# Patient Record
Sex: Male | Born: 2000 | Race: White | Hispanic: No | Marital: Single | Smoking: Former smoker
Health system: Southern US, Community
[De-identification: ages and names within clinical notes are randomized; demographics above are authoritative.]

---
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
## 2020-08-21 ENCOUNTER — Other Ambulatory Visit: Payer: Self-pay

## 2020-08-21 DIAGNOSIS — R21 Rash and other nonspecific skin eruption: Secondary | ICD-10-CM | POA: Insufficient documentation

## 2020-08-21 DIAGNOSIS — Z5321 Procedure and treatment not carried out due to patient leaving prior to being seen by health care provider: Secondary | ICD-10-CM | POA: Insufficient documentation

## 2020-08-22 ENCOUNTER — Other Ambulatory Visit: Payer: Self-pay

## 2020-08-22 ENCOUNTER — Encounter (HOSPITAL_BASED_OUTPATIENT_CLINIC_OR_DEPARTMENT_OTHER): Payer: Self-pay | Admitting: *Deleted

## 2020-08-22 ENCOUNTER — Emergency Department (HOSPITAL_BASED_OUTPATIENT_CLINIC_OR_DEPARTMENT_OTHER)
Admission: EM | Admit: 2020-08-22 | Discharge: 2020-08-22 | Disposition: A | Discharge status: Home / Self Care | Payer: Self-pay | Attending: Emergency Medicine | Admitting: Emergency Medicine

## 2020-08-22 NOTE — ED Triage Notes (Addendum)
C/o rash to neck abd and arms x 1 day , seen at [facility] for same, no relief with meds, at completion of triage mother states " we are leaving "

## 2020-10-11 ENCOUNTER — Other Ambulatory Visit: Payer: Self-pay

## 2020-10-11 ENCOUNTER — Ambulatory Visit: Payer: Self-pay | Admitting: Gastroenterology

## 2020-10-11 ENCOUNTER — Encounter: Payer: Self-pay | Admitting: Gastroenterology

## 2020-10-11 VITALS — BP 108/62 | HR 64 | Ht 71.0 in | Wt 134.0 lb

## 2020-10-11 DIAGNOSIS — R21 Rash and other nonspecific skin eruption: Secondary | ICD-10-CM | POA: Insufficient documentation

## 2020-10-11 DIAGNOSIS — M62838 Other muscle spasm: Secondary | ICD-10-CM | POA: Insufficient documentation

## 2020-10-11 DIAGNOSIS — M791 Myalgia, unspecified site: Secondary | ICD-10-CM | POA: Insufficient documentation

## 2020-10-11 NOTE — Patient Instructions (Addendum)
If you are age 22 or older, your body mass index should be between 23-30. Your Body mass index is 18.69 kg/m. If this is out of the aforementioned range listed, please consider follow up with your Primary Care Provider.  If you are age 15 or younger, your body mass index should be between 19-25. Your Body mass index is 18.69 kg/m. If this is out of the aformentioned range listed, please consider follow up with your Primary Care Provider.   These were recommended by Doug Sou, PA-C : CBC, CMP, TSH, CRP, Sed rate, CK, and Celiac.   Also recommended that you see Rheumatology Dr.Deveshwar.   Thank you for choosing me and [facility].  Doug Sou, PA-C

## 2020-10-11 NOTE — Progress Notes (Signed)
10/11/2020 CHRISTIE COPLEY [id number] 23-Dec-2000   HISTORY OF PRESENT ILLNESS: This is a 20 year old male who is new to our office.  He is here today with his mom as a self-referral for evaluation of several issues.  They are currently uninsured and also trying to switch between a pediatrician and establish care with a general primary care physician.  He describes intermittent what sounds like muscle spasms or twitching in his abdomen.  He said that it causes like an achy discomfort, but no pain.  He says that his bowel habits are irregular, sometimes he will skip a day and sometimes he will go a couple times a day, but nothing extreme in either direction.  He also reports pins-and-needles sensation in his wrists and in his groin/testicle area.  He also reports headaches and sensitive vision recently.  He also recently broke out in a rash all over his back, his neck, his hands, and in his groin.  He said he never had a rash like that before.  They called at hives in the emergency room, but they could never figure out what would have been from as there was no changes in laundry detergents, etc.  He also reports muscle pains in the backs of his legs.  He says his appetite is great and he eats all the time, but has been unable to gain weight.  He was smoking marijuana regularly, but has stopped that recently.  He denies any nausea, vomiting, fever, chills.   Past Medical History:  Diagnosis Date  . Asthma    No past surgical history on file.  reports that he has never smoked. He has never used smokeless tobacco. He reports previous alcohol use. He reports previous drug use. family history is not on file. No Known Allergies    Outpatient Encounter Medications as of 10/11/2020  Medication Sig  . albuterol (PROVENTIL HFA;VENTOLIN HFA) 108 (90 BASE) MCG/ACT inhaler Inhale 2 puffs into the lungs every 6 (six) hours as needed. For shortness of breath  . [DISCONTINUED] cetirizine (ZYRTEC) 10 MG tablet  Take 1 tablet (10 mg total) 2 (two) times daily by mouth.  . [DISCONTINUED] diphenhydrAMINE (BENADRYL) 25 mg capsule Take 1 capsule (25 mg total) every 6 (six) hours as needed by mouth.  . [DISCONTINUED] famotidine (PEPCID) 40 MG tablet Take 1 tablet (40 mg total) daily by mouth.  . [DISCONTINUED] predniSONE (DELTASONE) 20 MG tablet Take 2 tablets (40 mg total) daily by mouth.   No facility-administered encounter medications on file as of 10/11/2020.     REVIEW OF SYSTEMS  : All other systems reviewed and negative except where noted in the History of Present Illness.   PHYSICAL EXAM: BP 108/62   Pulse 64   Ht 5\' 11"  (1.803 m)   Wt 134 lb (60.8 kg)   BMI 18.69 kg/m  General: Well developed, but thin white male in no acute distress Head: Normocephalic and atraumatic Eyes:  Sclerae anicteric, conjunctiva pink. Ears: Normal auditory acuity Lungs: Clear throughout to auscultation; no W/R/R. Heart: Regular rate and rhythm; no M/R/G. Abdomen: Soft, non-distended.  BS present.  Non-tender. Musculoskeletal: Symmetrical with no gross deformities  Skin: No lesions on visible extremities Extremities: No edema  Neurological: Alert oriented x 4, grossly non-focal Psychological:  Alert and cooperative. Normal mood and affect  ASSESSMENT AND PLAN: *20 year old male here today with several complaints.  He complains of muscle spasms/pulsations/twitchings on his abdomen, pins and needle sensations on his wrist and in his  groin/testicular area, muscle pains in his legs, a rash that broke out recently all over his back, his hands, his neck, his groin, etc, headaches, inability to gain weight, etc.  He is here as a self-referral as they are trying to transfer from a pediatrician to a regular primary care doctor.  His mother was not really quite sure where to start with all of this.  They are currently uninsured.  I do not think that is a primary GI issue driving his symptoms.  May be something  rheumatologic??  We recommended at least starting out with lab evaluation including CBC, CMP, TSH, sed rate, CRP, CK, and celiac labs.  After discussion they ultimately declined to have the labs performed today would like to hold off and have further discussion about where to go from here, etc.   CC:  Ermalinda Barrios, MD

## 2020-10-12 NOTE — Progress Notes (Signed)
Attending Physician's Attestation   I have reviewed the chart.   I agree with the Advanced Practitioner's note, impression, and recommendations with any updates as below. Multiple issues brought up today based on clinic evaluation though unclear that there is an [employer], [employer] diagnosis for all symptoms.  Needs evaluation by primary care to try to piece together multiple issues.  Agree with evaluation via laboratories at minimum to evaluate for potential abnormalities but at end of clinic this was deferred per notation.  Happy to be of assistance in future if patient/family desire.   Corliss Parish, MD [facility] Advanced Endoscopy Office # [phone]

## 2020-11-15 ENCOUNTER — Ambulatory Visit (INDEPENDENT_AMBULATORY_CARE_PROVIDER_SITE_OTHER): Payer: Self-pay | Admitting: Registered Nurse

## 2020-11-15 ENCOUNTER — Other Ambulatory Visit: Payer: Self-pay

## 2020-11-15 ENCOUNTER — Encounter: Payer: Self-pay | Admitting: Registered Nurse

## 2020-11-15 VITALS — BP 119/72 | HR 69 | Temp 97.6°F | Resp 18 | Ht 71.0 in | Wt 144.2 lb

## 2020-11-15 DIAGNOSIS — F411 Generalized anxiety disorder: Secondary | ICD-10-CM

## 2020-11-15 DIAGNOSIS — K219 Gastro-esophageal reflux disease without esophagitis: Secondary | ICD-10-CM

## 2020-11-15 MED ORDER — PANTOPRAZOLE SODIUM 40 MG PO TBEC: 40.0000 mg | DELAYED_RELEASE_TABLET | Freq: Every day | ORAL | 3 refills | Status: AC

## 2020-11-15 MED ORDER — ESCITALOPRAM OXALATE 10 MG PO TABS: ORAL_TABLET | ORAL | 0 refills | Status: AC

## 2020-11-15 NOTE — Patient Instructions (Addendum)
Kenneth Clarke -   Good to meet you today. Glad we could talk. I'm hoping these medications help, but I have also attached information on anxiety and GERD to this sheet.  Medications:  Escitalopram (Lexapro): for anxiety. Take 1/2 tablet before bed daily for one week, then increase to 1 tablet before bed daily. I'd like to check in on this medication in around 4-6 weeks to make sure things are going smoothly. If you have any side effects, you can stop taking this medication. Sometimes, there is some nausea or diarrhea for the first week or two. This usually resolves on it's own.  Pantoprazole (Protonix): for heartburn / GERD. Take 1 capsule first thing in the morning with a glass of water daily for 14 days. After that, you can use as needed when the pain arises. Refer to the information below for nonpharmacological management of heartburn.  Looking forward to seeing what progress we can make.    If you have lab work done today you will be contacted with your lab results within the next 2 weeks.  If you have not heard from [place] then please contact us. The fastest way to get your results is to register for My Chart.   IF you received an x-ray today, you will receive an invoice from [facility]. Please contact [facility] at [phone] with questions or concerns regarding your invoice.   IF you received labwork today, you will receive an invoice from [facility]. Please contact [facility] at [phone] with questions or concerns regarding your invoice.   Our billing staff will not be able to assist you with questions regarding bills from these companies.  You will be contacted with the lab results as soon as they are available. The fastest way to get your results is to activate your My Chart account. Instructions are located on the last page of this paperwork. If you have not heard from [place] regarding the results in 2 weeks, please contact this office.      Food Choices for  Gastroesophageal Reflux Disease, Adult When you have gastroesophageal reflux disease (GERD), the foods you eat and your eating habits are very important. Choosing the right foods can help ease the discomfort of GERD. Consider working with a dietitian to help you make healthy food choices. What are tips for following this plan? Reading food labels  Look for foods that are low in saturated fat. Foods that have less than 5% of daily value (DV) of fat and 0 g of trans fats may help with your symptoms. Cooking  Cook foods using methods other than frying. This may include baking, steaming, grilling, or broiling. These are all methods that do not need a lot of fat for cooking.  To add flavor, try to use herbs that are low in spice and acidity. Meal planning  Choose healthy foods that are low in fat, such as fruits, vegetables, whole grains, low-fat dairy products, lean meats, fish, and poultry.  Eat frequent, small meals instead of three large meals each day. Eat your meals slowly, in a relaxed setting. Avoid bending over or lying down until 2-3 hours after eating.  Limit high-fat foods such as fatty meats or fried foods.  Limit your intake of fatty foods, such as oils, butter, and shortening.  Avoid the following as told by your health care provider: ? Foods that cause symptoms. These may be different for different people. Keep a food diary to keep track of foods that cause symptoms. ? Alcohol. ?  Drinking large amounts of liquid with meals. ? Eating meals during the 2-3 hours before bed.   Lifestyle  Maintain a healthy weight. Ask your health care provider what weight is healthy for you. If you need to lose weight, work with your health care provider to do so safely.  Exercise for at least 30 minutes on 5 or more days each week, or as told by your health care provider.  Avoid wearing clothes that fit tightly around your waist and chest.  Do not use any products that contain nicotine or  tobacco. These products include cigarettes, chewing tobacco, and vaping devices, such as e-cigarettes. If you need help quitting, ask your health care provider.  Sleep with the head of your bed raised. Use a wedge under the mattress or blocks under the bed frame to raise the head of the bed.  Chew sugar-free gum after mealtimes. What foods should I eat? Eat a healthy, well-balanced diet of fruits, vegetables, whole grains, low-fat dairy products, lean meats, fish, and poultry. Each person is different. Foods that may trigger symptoms in one person may not trigger any symptoms in another person. Work with your health care provider to identify foods that are safe for you. The items listed above may not be a complete list of recommended foods and beverages. Contact a dietitian for more information.   What foods should I avoid? Limiting some of these foods may help manage the symptoms of GERD. Everyone is different. Consult a dietitian or your health care provider to help you identify the exact foods to avoid, if any. Fruits Any fruits prepared with added fat. Any fruits that cause symptoms. For some people this may include citrus fruits, such as oranges, grapefruit, pineapple, and lemons. Vegetables Deep-fried vegetables. [place] fries. Any vegetables prepared with added fat. Any vegetables that cause symptoms. For some people, this may include tomatoes and tomato products, chili peppers, onions and garlic, and horseradish. Grains Pastries or quick breads with added fat. Meats and other proteins High-fat meats, such as fatty beef or pork, hot dogs, ribs, ham, sausage, salami, and bacon. Fried meat or protein, including fried fish and fried chicken. Nuts and nut butters, in large amounts. Dairy Whole milk and chocolate milk. Sour cream. Cream. Ice cream. Cream cheese. Milkshakes. Fats and oils Butter. Margarine. Shortening. Ghee. Beverages Coffee and tea, with or without caffeine. Carbonated  beverages. Sodas. Energy drinks. Fruit juice made with acidic fruits, such as orange or grapefruit. Tomato juice. Alcoholic drinks. Sweets and desserts Chocolate and cocoa. Donuts. Seasonings and condiments Pepper. Peppermint and spearmint. Added salt. Any condiments, herbs, or seasonings that cause symptoms. For some people, this may include curry, hot sauce, or vinegar-based salad dressings. The items listed above may not be a complete list of foods and beverages to avoid. Contact a dietitian for more information. Questions to ask your health care provider Diet and lifestyle changes are usually the first steps that are taken to manage symptoms of GERD. If diet and lifestyle changes do not improve your symptoms, talk with your health care provider about taking medicines. Where to find more information  International Foundation for Gastrointestinal Disorders: aboutgerd.org Summary  When you have gastroesophageal reflux disease (GERD), food and lifestyle choices may be very helpful in easing the discomfort of GERD.  Eat frequent, small meals instead of three large meals each day. Eat your meals slowly, in a relaxed setting. Avoid bending over or lying down until 2-3 hours after eating.  Limit high-fat foods such  as fatty meats or fried foods. This information is not intended to replace advice given to you by your health care provider. Make sure you discuss any questions you have with your health care provider. Document Revised: 03/13/2020 Document Reviewed: 03/13/2020 Elsevier Patient Education  [place].  [employer]'s Current Therapy 2021 (pp. 213-216). [place], [place]: Elsevier.">  Gastroesophageal Reflux Disease, Adult Gastroesophageal reflux (GER) happens when acid from the stomach flows up into the tube that connects the mouth and the stomach (esophagus). Normally, food travels down the esophagus and stays in the stomach to be digested. However, when a person has GER, food and  stomach acid sometimes move back up into the esophagus. If this becomes a more serious problem, the person may be diagnosed with a disease called gastroesophageal reflux disease (GERD). GERD occurs when the reflux:  Happens often.  Causes frequent or severe symptoms.  Causes problems such as damage to the esophagus. When stomach acid comes in contact with the esophagus, the acid may cause inflammation in the esophagus. Over time, GERD may create small holes (ulcers) in the lining of the esophagus. What are the causes? This condition is caused by a problem with the muscle between the esophagus and the stomach (lower esophageal sphincter, or LES). Normally, the LES muscle closes after food passes through the esophagus to the stomach. When the LES is weakened or abnormal, it does not close properly, and that allows food and stomach acid to go back up into the esophagus. The LES can be weakened by certain dietary substances, medicines, and medical conditions, including:  Tobacco use.  Pregnancy.  Having a hiatal hernia.  Alcohol use.  Certain foods and beverages, such as coffee, chocolate, onions, and peppermint. What increases the risk? You are more likely to develop this condition if you:  Have an increased body weight.  Have a connective tissue disorder.  Take NSAIDs, such as ibuprofen. What are the signs or symptoms? Symptoms of this condition include:  Heartburn.  Difficult or painful swallowing and the feeling of having a lump in the throat.  A bitter taste in the mouth.  Bad breath and having a large amount of saliva.  Having an upset or bloated stomach and belching.  Chest pain. Different conditions can cause chest pain. Make sure you see your health care provider if you experience chest pain.  Shortness of breath or wheezing.  Ongoing (chronic) cough or a nighttime cough.  Wearing away of tooth enamel.  Weight loss. How is this diagnosed? This condition may be  diagnosed based on a medical history and a physical exam. To determine if you have mild or severe GERD, your health care provider may also monitor how you respond to treatment. You may also have tests, including:  A test to examine your stomach and esophagus with a small camera (endoscopy).  A test that measures the acidity level in your esophagus.  A test that measures how much pressure is on your esophagus.  A barium swallow or modified barium swallow test to show the shape, size, and functioning of your esophagus. How is this treated? Treatment for this condition may vary depending on how severe your symptoms are. Your health care provider may recommend:  Changes to your diet.  Medicine.  Surgery. The goal of treatment is to help relieve your symptoms and to prevent complications. Follow these instructions at home: Eating and drinking  Follow a diet as recommended by your health care provider. This may involve avoiding foods and drinks  such as: ? Coffee and tea, with or without caffeine. ? Drinks that contain alcohol. ? Energy drinks and sports drinks. ? Carbonated drinks or sodas. ? Chocolate and cocoa. ? Peppermint and mint flavorings. ? Garlic and onions. ? Horseradish. ? Spicy and acidic foods, including peppers, chili powder, curry powder, vinegar, hot sauces, and barbecue sauce. ? Citrus fruit juices and citrus fruits, such as oranges, lemons, and limes. ? Tomato-based foods, such as red sauce, chili, salsa, and pizza with red sauce. ? Fried and fatty foods, such as donuts, french fries, potato chips, and high-fat dressings. ? High-fat meats, such as hot dogs and fatty cuts of red and white meats, such as rib eye steak, sausage, ham, and bacon. ? High-fat dairy items, such as whole milk, butter, and cream cheese.  Eat small, frequent meals instead of large meals.  Avoid drinking large amounts of liquid with your meals.  Avoid eating meals during the 2-3 hours before  bedtime.  Avoid lying down right after you eat.  Do not exercise right after you eat.   Lifestyle  Do not use any products that contain nicotine or tobacco. These products include cigarettes, chewing tobacco, and vaping devices, such as e-cigarettes. If you need help quitting, ask your health care provider.  Try to reduce your stress by using methods such as yoga or meditation. If you need help reducing stress, ask your health care provider.  If you are overweight, reduce your weight to an amount that is healthy for you. Ask your health care provider for guidance about a safe weight loss goal.   General instructions  Pay attention to any changes in your symptoms.  Take over-the-counter and prescription medicines only as told by your health care provider. Do not take aspirin, ibuprofen, or other NSAIDs unless your health care provider told you to take these medicines.  Wear loose-fitting clothing. Do not wear anything tight around your waist that causes pressure on your abdomen.  Raise (elevate) the head of your bed about 6 inches (15 cm). You can use a wedge to do this.  Avoid bending over if this makes your symptoms worse.  Keep all follow-up visits. This is important. Contact a health care provider if:  You have: ? New symptoms. ? Unexplained weight loss. ? Difficulty swallowing or it hurts to swallow. ? Wheezing or a persistent cough. ? A hoarse voice.  Your symptoms do not improve with treatment. Get help right away if:  You have sudden pain in your arms, neck, jaw, teeth, or back.  You suddenly feel sweaty, dizzy, or light-headed.  You have chest pain or shortness of breath.  You vomit and the vomit is green, yellow, or black, or it looks like blood or coffee grounds.  You faint.  You have stool that is red, bloody, or black.  You cannot swallow, drink, or eat. These symptoms may represent a serious problem that is an emergency. Do not wait to see if the  symptoms will go away. Get medical help right away. Call your local emergency services (911 in the [place] Do not drive yourself to the hospital. Summary  Gastroesophageal reflux happens when acid from the stomach flows up into the esophagus. GERD is a disease in which the reflux happens often, causes frequent or severe symptoms, or causes problems such as damage to the esophagus.  Treatment for this condition may vary depending on how severe your symptoms are. Your health care provider may recommend diet and lifestyle changes, medicine, or  surgery.  Contact a health care provider if you have new or worsening symptoms.  Take over-the-counter and prescription medicines only as told by your health care provider. Do not take aspirin, ibuprofen, or other NSAIDs unless your health care provider told you to do so.  Keep all follow-up visits as told by your health care provider. This is important. This information is not intended to replace advice given to you by your health care provider. Make sure you discuss any questions you have with your health care provider. Document Revised: 03/13/2020 Document Reviewed: 03/13/2020 Elsevier Patient Education  2021 [place].  [email or web address].NIH.Gov">  Generalized Anxiety Disorder, Adult Generalized anxiety disorder (GAD) is a mental health condition. Unlike normal worries, anxiety related to GAD is not triggered by a specific event. These worries do not fade or get better with time. GAD interferes with relationships, work, and school. GAD symptoms can vary from mild to severe. People with severe GAD can have intense waves of anxiety with physical symptoms that are similar to panic attacks. What are the causes? The exact cause of GAD is not known, but the following are believed to have an impact:  Differences in natural brain chemicals.  Genes passed down from parents to children.  Differences in the way threats are perceived.  Development during  childhood.  Personality. What increases the risk? The following factors may make you more likely to develop this condition:  Being male.  Having a family history of anxiety disorders.  Being very shy.  Experiencing very stressful life events, such as the death of a loved one.  Having a very stressful family environment. What are the signs or symptoms? People with GAD often worry excessively about many things in their lives, such as their health and family. Symptoms may also include:  Mental and emotional symptoms: ? Worrying excessively about natural disasters. ? Fear of being late. ? Difficulty concentrating. ? Fears that others are judging your performance.  Physical symptoms: ? Fatigue. ? Headaches, muscle tension, muscle twitches, trembling, or feeling shaky. ? Feeling like your heart is pounding or beating very fast. ? Feeling out of breath or like you cannot take a deep breath. ? Having trouble falling asleep or staying asleep, or experiencing restlessness. ? Sweating. ? Nausea, diarrhea, or irritable bowel syndrome (IBS).  Behavioral symptoms: ? Experiencing erratic moods or irritability. ? Avoidance of new situations. ? Avoidance of people. ? Extreme difficulty making decisions. How is this diagnosed? This condition is diagnosed based on your symptoms and medical history. You will also have a physical exam. Your health care provider may perform tests to rule out other possible causes of your symptoms. To be diagnosed with GAD, a person must have anxiety that:  Is out of his or her control.  Affects several different aspects of his or her life, such as work and relationships.  Causes distress that makes him or her unable to take part in normal activities.  Includes at least three symptoms of GAD, such as restlessness, fatigue, trouble concentrating, irritability, muscle tension, or sleep problems. Before your health care provider can confirm a diagnosis of  GAD, these symptoms must be present more days than they are not, and they must last for 6 months or longer. How is this treated? This condition may be treated with:  Medicine. Antidepressant medicine is usually prescribed for long-term daily control. Anti-anxiety medicines may be added in severe cases, especially when panic attacks occur.  Talk therapy (psychotherapy). Certain types of talk therapy can  be helpful in treating GAD by providing support, education, and guidance. Options include: ? Cognitive behavioral therapy (CBT). People learn coping skills and self-calming techniques to ease their physical symptoms. They learn to identify unrealistic thoughts and behaviors and to replace them with more appropriate thoughts and behaviors. ? Acceptance and commitment therapy (ACT). This treatment teaches people how to be mindful as a way to cope with unwanted thoughts and feelings. ? Biofeedback. This process trains you to manage your body's response (physiological response) through breathing techniques and relaxation methods. You will work with a therapist while machines are used to monitor your physical symptoms.  Stress management techniques. These include yoga, meditation, and exercise. A mental health specialist can help determine which treatment is best for you. Some people see improvement with one type of therapy. However, other people require a combination of therapies.   Follow these instructions at home: Lifestyle  Maintain a consistent routine and schedule.  Anticipate stressful situations. Create a plan, and allow extra time to work with your plan.  Practice stress management or self-calming techniques that you have learned from your therapist or your health care provider. General instructions  Take over-the-counter and prescription medicines only as told by your health care provider.  Understand that you are likely to have setbacks. Accept this and be kind to yourself as you  persist to take better care of yourself.  Recognize and accept your accomplishments, even if you judge them as small.  Keep all follow-up visits as told by your health care provider. This is important. Contact a health care provider if:  Your symptoms do not get better.  Your symptoms get worse.  You have signs of depression, such as: ? A persistently sad or irritable mood. ? Loss of enjoyment in activities that used to bring you joy. ? Change in weight or eating. ? Changes in sleeping habits. ? Avoiding friends or family members. ? Loss of energy for normal tasks. ? Feelings of guilt or worthlessness. Get help right away if:  You have serious thoughts about hurting yourself or others. If you ever feel like you may hurt yourself or others, or have thoughts about taking your own life, get help right away. Go to your nearest emergency department or:  Call your local emergency services (911 in the [place]  Call a suicide crisis helpline, such as the [facility] at (917)668-6815. This is open 24 hours a day in the [place]  Text the Crisis Text Line at 707-532-9116 (in the [place].). Summary  Generalized anxiety disorder (GAD) is a mental health condition that involves worry that is not triggered by a specific event.  People with GAD often worry excessively about many things in their lives, such as their health and family.  GAD may cause symptoms such as restlessness, trouble concentrating, sleep problems, frequent sweating, nausea, diarrhea, headaches, and trembling or muscle twitching.  A mental health specialist can help determine which treatment is best for you. Some people see improvement with one type of therapy. However, other people require a combination of therapies. This information is not intended to replace advice given to you by your health care provider. Make sure you discuss any questions you have with your health care provider. Document Revised:  06/23/2019 Document Reviewed: 06/23/2019 Elsevier Patient Education  [place], Adult After being diagnosed with an anxiety disorder, you may be relieved to know why you have felt or behaved a certain way. You may also feel overwhelmed  about the treatment ahead and what it will mean for your life. With care and support, you can manage this condition and recover from it. How to manage lifestyle changes Managing stress and anxiety Stress is your body's reaction to life changes and events, both good and bad. Most stress will last just a few hours, but stress can be ongoing and can lead to more than just stress. Although stress can play a major role in anxiety, it is not the same as anxiety. Stress is usually caused by something external, such as a deadline, test, or competition. Stress normally passes after the triggering event has ended.  Anxiety is caused by something internal, such as imagining a terrible outcome or worrying that something will go wrong that will devastate you. Anxiety often does not go away even after the triggering event is over, and it can become long-term (chronic) worry. It is important to understand the differences between stress and anxiety and to manage your stress effectively so that it does not lead to an anxious response. Talk with your health care provider or a counselor to learn more about reducing anxiety and stress. He or she may suggest tension reduction techniques, such as:  Music therapy. This can include creating or listening to music that you enjoy and that inspires you.  Mindfulness-based meditation. This involves being aware of your normal breaths while not trying to control your breathing. It can be done while sitting or walking.  Centering prayer. This involves focusing on a word, phrase, or sacred image that means something to you and brings you peace.  Deep breathing. To do this, expand your stomach and inhale slowly through your  nose. Hold your breath for 3-5 seconds. Then exhale slowly, letting your stomach muscles relax.  Self-talk. This involves identifying thought patterns that lead to anxiety reactions and changing those patterns.  Muscle relaxation. This involves tensing muscles and then relaxing them. Choose a tension reduction technique that suits your lifestyle and personality. These techniques take time and practice. Set aside 5-15 minutes a day to do them. Therapists can offer counseling and training in these techniques. The training to help with anxiety may be covered by some insurance plans. Other things you can do to manage stress and anxiety include:  Keeping a stress/anxiety diary. This can help you learn what triggers your reaction and then learn ways to manage your response.  Thinking about how you react to certain situations. You may not be able to control everything, but you can control your response.  Making time for activities that help you relax and not feeling guilty about spending your time in this way.  Visual imagery and yoga can help you stay calm and relax.   Medicines Medicines can help ease symptoms. Medicines for anxiety include:  Anti-anxiety drugs.  Antidepressants. Medicines are often used as a primary treatment for anxiety disorder. Medicines will be prescribed by a health care provider. When used together, medicines, psychotherapy, and tension reduction techniques may be the most effective treatment. Relationships Relationships can play a big part in helping you recover. Try to spend more time connecting with trusted friends and family members. Consider going to couples counseling, taking family education classes, or going to family therapy. Therapy can help you and others better understand your condition. How to recognize changes in your anxiety Everyone responds differently to treatment for anxiety. Recovery from anxiety happens when symptoms decrease and stop interfering with  your daily activities at home or work. This may mean  that you will start to:  Have better concentration and focus. Worry will interfere less in your daily thinking.  Sleep better.  Be less irritable.  Have more energy.  Have improved memory. It is important to recognize when your condition is getting worse. Contact your health care provider if your symptoms interfere with home or work and you feel like your condition is not improving. Follow these instructions at home: Activity  Exercise. Most adults should do the following: ? Exercise for at least 150 minutes each week. The exercise should increase your heart rate and make you sweat (moderate-intensity exercise). ? Strengthening exercises at least twice a week.  Get the right amount and quality of sleep. Most adults need 7-9 hours of sleep each night. Lifestyle  Eat a healthy diet that includes plenty of vegetables, fruits, whole grains, low-fat dairy products, and lean protein. Do not eat a lot of foods that are high in solid fats, added sugars, or salt.  Make choices that simplify your life.  Do not use any products that contain nicotine or tobacco, such as cigarettes, e-cigarettes, and chewing tobacco. If you need help quitting, ask your health care provider.  Avoid caffeine, alcohol, and certain over-the-counter cold medicines. These may make you feel worse. Ask your pharmacist which medicines to avoid.   General instructions  Take over-the-counter and prescription medicines only as told by your health care provider.  Keep all follow-up visits as told by your health care provider. This is important. Where to find support You can get help and support from these sources:  Self-help groups.  Online and [employer].  A trusted spiritual leader.  Couples counseling.  Family education classes.  Family therapy. Where to find more information You may find that joining a support group helps you deal with your  anxiety. The following sources can help you locate counselors or support groups near you:  [place]: www.mentalhealthamerica.net  Anxiety and Depression Association of [place] (ADAA): https://www.clark.net/  National Alliance on Mental Illness (NAMI): www.nami.org Contact a health care provider if you:  Have a hard time staying focused or finishing daily tasks.  Spend many hours a day feeling worried about everyday life.  Become exhausted by worry.  Start to have headaches, feel tense, or have nausea.  Urinate more than normal.  Have diarrhea. Get help right away if you have:  A racing heart and shortness of breath.  Thoughts of hurting yourself or others. If you ever feel like you may hurt yourself or others, or have thoughts about taking your own life, get help right away. You can go to your nearest emergency department or call:  Your local emergency services (911 in the [place]  A suicide crisis helpline, such as the [place] at 774-255-8614. This is open 24 hours a day. Summary  Taking steps to learn and use tension reduction techniques can help calm you and help prevent triggering an anxiety reaction.  When used together, medicines, psychotherapy, and tension reduction techniques may be the most effective treatment.  Family, friends, and partners can play a big part in helping you recover from an anxiety disorder. This information is not intended to replace advice given to you by your health care provider. Make sure you discuss any questions you have with your health care provider. Document Revised: 02/02/2019 Document Reviewed: 02/02/2019 Elsevier Patient Education  [place].  Budget-Friendly Healthy Eating There are many ways to save money at the grocery store and continue to  eat healthy. You can be successful if you:  Plan meals according to your budget.  Make a grocery list and only purchase food according to your  grocery list.  Prepare food yourself at home. What are tips for following this plan? Reading food labels  Compare food labels between brand name foods and the store brand. Often the nutritional value is the same, but the store brand is lower cost.  Look for products that do not have added sugar, fat, or salt (sodium). These often cost the same but are healthier for you. Products may be labeled as: ? Sugar-free. ? Nonfat. ? Low-fat. ? Sodium-free. ? Low-sodium.  Look for lean ground beef labeled as at least 92% lean and 8% fat. Shopping  Buy only the items on your grocery list and go only to the areas of the store that have the items on your list.  Use coupons only for foods and brands you normally buy. Avoid buying items you wouldn't normally buy simply because they are on sale.  Check online and in newspapers for weekly deals.  Buy healthy items from the bulk bins when available, such as herbs, spices, flour, pasta, nuts, and dried fruit.  Buy fruits and vegetables that are in season. Prices are usually lower on in-season produce.  Look at the unit price on the price tag. Use it to compare different brands and sizes to find out which item is the best deal.  Choose healthy items that are often low-cost, such as carrots, potatoes, apples, bananas, and oranges. Dried or canned beans are a low-cost protein source.  Buy in bulk and freeze extra food. Items you can buy in bulk include meats, fish, poultry, frozen fruits, and frozen vegetables.  Avoid buying "ready-to-eat" foods, such as pre-cut fruits and vegetables and pre-made salads.  If possible, shop around to discover where you can find the best prices. Consider other retailers such as dollar stores, larger [employer], local fruit and vegetable stands, and farmers markets.  Do not shop when you are hungry. If you shop while hungry, it may be hard to stick to your list and budget.  Resist impulse buying. Use your grocery  list as your official plan for the week.  Buy a variety of vegetables and fruits by purchasing fresh, frozen, and canned items.  Look at the top and bottom shelves for deals. Foods at eye level (eye level of an adult or child) are usually more expensive.  Be efficient with your time when shopping. The more time you spend at the store, the more money you are likely to spend.  To save money when choosing more expensive foods like meats and dairy: ? Choose cheaper cuts of meat, such as bone-in chicken thighs and drumsticks instead of skinless and boneless chicken. When you are ready to prepare the chicken, you can remove the skin yourself to make it healthier. ? Choose lean meats like chicken or [place] instead of beef. ? Choose canned seafood, such as tuna, salmon, or sardines. ? Buy eggs as a low-cost source of protein. ? Buy dried beans and peas, such as lentils, split peas, or kidney beans instead of meats. Dried beans and peas are a good alternative source of protein. ? Buy the larger tubs of yogurt instead of individual-sized containers.  Choose water instead of sodas and other sweetened beverages.  Avoid buying chips, cookies, and other "junk food." These items are usually expensive and not healthy.   Cooking  Make extra food and  freeze the extras in meal-sized containers or in individual portions for fast meals and snacks.  Pre-cook on days when you have extra time to prepare meals in advance. You can keep these meals in the fridge or freezer and reheat for a quick meal.  When you come home from the grocery store, wash, peel, and cut fruits and vegetables so they are ready to use and eat. This will help reduce food waste. Meal planning  Do not eat out or get fast food. Prepare food at home.  Make a grocery list and make sure to bring it with you to the store. If you have a smart phone, you could use your phone to create your shopping list.  Plan meals and snacks according to a  grocery list and budget you create.  Use leftovers in your meal plan for the week.  Look for recipes where you can cook once and make enough food for two meals.  Prepare budget-friendly types of meals like stews, casseroles, and stir-fry dishes.  Try some meatless meals or try "no cook" meals like salads.  Make sure that half your plate is filled with fruits or vegetables. Choose from fresh, frozen, or canned fruits and vegetables. If eating canned, remember to rinse them before eating. This will remove any excess salt added for packaging. Summary  Eating healthy on a budget is possible if you plan your meals according to your budget, purchase according to your budget and grocery list, and prepare food yourself.  Tips for buying more food on a limited budget include buying generic brands, using coupons only for foods you normally buy, and buying healthy items from the bulk bins when available.  Tips for buying cheaper food to replace expensive food include choosing cheaper, lean cuts of meat, and buying dried beans and peas. This information is not intended to replace advice given to you by your health care provider. Make sure you discuss any questions you have with your health care provider. Document Revised: 06/15/2020 Document Reviewed: 06/15/2020 Elsevier Patient Education  2021 [place] for Athletes Athletes need strength and endurance. Following a healthy eating plan can help you build and maintain this. As an athlete, the amount of calories you need depends on your weight, age, gender, and sport. Working with a sports Building services engineer can help you determine how many calories you need each day. Calories come in the form of carbohydrates, fats, and proteins. The following is a general guide for athletes:  Carbohydrates are a good source of energy. Try to get 55-60% of your calories from carbohydrates.  Fats are a good source of energy. Try to get no more than 30%  of your calories from fats.  Proteins help build and maintain muscle, but they are not a great source of energy. Try to get 10-15% of your calories from proteins. What are tips for following this plan? General tips  Eat about 2-4 hours before sports activities.  Drink enough fluids to prevent dehydration. ? Drink a small amount of water or other caffeine-free drink every 15-20 minutes while you are active. Do not wait until you get thirsty. ? Water is best for replacing body fluid losses (rehydration) during daily workouts and shorter events. ? Drink an electrolyte replacement fluid during long workouts and events. This is important because sweating may reduce your body minerals (electrolytes).  After a sports activity, drink a protein drink, such as chocolate milk, to help with muscle recovery.  During training, eat  and drink regularly throughout the day. Do not skip meals. Meals should include carbohydrates, proteins, and fat. Consider small, frequent meals to meet energy needs.  Taking extra vitamins, minerals, or other supplements is usually not necessary if you follow a healthy eating plan. Calcium and iron Include foods that are rich in calcium and iron, such as whole-grain cereal with milk. Calcium helps keep bones strong, and iron helps keep muscles healthy and working efficiently. Simple and complex carbohydrates  Eat or drink simple carbohydrates for a quick energy boost. These include: ? Energy bars. ? Chocolate. ? Sugary sports drinks.  Eat or drink complex carbohydrates for long-term energy. These include: ? Fortified whole-grain cereal or oatmeal. ? Fruits and vegetables, including leafy green vegetables. ? Whole-grain breads, pastas, or brown rice.   Unsaturated fats  Unsaturated fats are the best type of fat to include in your diet. They do not raise your risk for heart disease, and they provide energy once your body burns through carbohydrates. Examples of  unsaturated fats include: ? Vegetable oils. ? Cold water fish, such as salmon, trout, Atlantic mackerel, cod, and sardines. ? Nuts. ? Seeds. Protein Include these sources of protein in your eating plan:  Lean meat.  Fish.  Poultry.  Dairy from milk, cheese, and yogurt. These are also good sources of calcium.  Nuts.  Soy.  Beans.  Peanut butter.   Summary  As an athlete, the amount of calories you need depends on your weight, age, gender, and sport.  Carbohydrates and fats are good sources of energy. Proteins are best for building and maintaining muscle.  Include foods in your diet that are rich in calcium and iron.  If you follow a healthy eating plan, then taking vitamins, minerals, or other supplements is usually not needed. This information is not intended to replace advice given to you by your health care provider. Make sure you discuss any questions you have with your health care provider. Document Revised: 01/06/2020 Document Reviewed: 01/06/2020 Elsevier Patient Education  2021 [place] Following a healthy eating pattern may help you to achieve and maintain a healthy body weight, reduce the risk of chronic disease, and live a long and productive life. It is important to follow a healthy eating pattern at an appropriate calorie level for your body. Your nutritional needs should be met primarily through food by choosing a variety of nutrient-rich foods. What are tips for following this plan? Reading food labels  Read labels and choose the following: ? Reduced or low sodium. ? Juices with 100% fruit juice. ? Foods with low saturated fats and high polyunsaturated and monounsaturated fats. ? Foods with whole grains, such as whole wheat, cracked wheat, brown rice, and wild rice. ? Whole grains that are fortified with folic acid. This is recommended for women who are pregnant or who want to become pregnant.  Read labels and avoid the  following: ? Foods with a lot of added sugars. These include foods that contain brown sugar, corn sweetener, corn syrup, dextrose, fructose, glucose, high-fructose corn syrup, honey, invert sugar, lactose, malt syrup, maltose, molasses, raw sugar, sucrose, trehalose, or turbinado sugar.  Do not eat more than the following amounts of added sugar per day:  6 teaspoons (25 g) for women.  9 teaspoons (38 g) for men. ? Foods that contain processed or refined starches and grains. ? Refined grain products, such as white flour, degermed cornmeal, white bread, and white rice. Shopping  Choose nutrient-rich snacks, such  as vegetables, whole fruits, and nuts. Avoid high-calorie and high-sugar snacks, such as potato chips, fruit snacks, and candy.  Use oil-based dressings and spreads on foods instead of solid fats such as butter, stick margarine, or cream cheese.  Limit pre-made sauces, mixes, and "instant" products such as flavored rice, instant noodles, and ready-made pasta.  Try more plant-protein sources, such as tofu, tempeh, black beans, edamame, lentils, nuts, and seeds.  Explore eating plans such as the Mediterranean diet or vegetarian diet. Cooking  Use oil to saut or stir-fry foods instead of solid fats such as butter, stick margarine, or lard.  Try baking, boiling, grilling, or broiling instead of frying.  Remove the fatty part of meats before cooking.  Steam vegetables in water or broth. Meal planning  At meals, imagine dividing your plate into fourths: ? One-half of your plate is fruits and vegetables. ? One-fourth of your plate is whole grains. ? One-fourth of your plate is protein, especially lean meats, poultry, eggs, tofu, beans, or nuts.  Include low-fat dairy as part of your daily diet.   Lifestyle  Choose healthy options in all settings, including home, work, school, restaurants, or stores.  Prepare your food safely: ? Wash your hands after handling raw  meats. ? Keep food preparation surfaces clean by regularly washing with hot, soapy water. ? Keep raw meats separate from ready-to-eat foods, such as fruits and vegetables. ? Cook seafood, meat, poultry, and eggs to the recommended internal temperature. ? Store foods at safe temperatures. In general:  Keep cold foods at 11F (4.4C) or below.  Keep hot foods at 111F (60C) or above.  Keep your freezer at [facility] (-17.8C) or below.  Foods are no longer safe to eat when they have been between the temperatures of 40-111F (4.4-60C) for more than 2 hours. What foods should I eat? Fruits Aim to eat 2 cup-equivalents of fresh, canned (in natural juice), or frozen fruits each day. Examples of 1 cup-equivalent of fruit include 1 small apple, 8 large strawberries, 1 cup canned fruit,  cup dried fruit, or 1 cup 100% juice. Vegetables Aim to eat 2-3 cup-equivalents of fresh and frozen vegetables each day, including different varieties and colors. Examples of 1 cup-equivalent of vegetables include 2 medium carrots, 2 cups raw, leafy greens, 1 cup chopped vegetable (raw or cooked), or 1 medium baked potato. Grains Aim to eat 6 ounce-equivalents of whole grains each day. Examples of 1 ounce-equivalent of grains include 1 slice of bread, 1 cup ready-to-eat cereal, 3 cups popcorn, or  cup cooked rice, pasta, or cereal. Meats and other proteins Aim to eat 5-6 ounce-equivalents of protein each day. Examples of 1 ounce-equivalent of protein include 1 egg, 1/2 cup nuts or seeds, or 1 tablespoon (16 g) peanut butter. A cut of meat or fish that is the size of a deck of cards is about 3-4 ounce-equivalents.  Of the protein you eat each week, try to have at least 8 ounces come from seafood. This includes salmon, trout, herring, and anchovies. Dairy Aim to eat 3 cup-equivalents of fat-free or low-fat dairy each day. Examples of 1 cup-equivalent of dairy include 1 cup (240 mL) milk, 8 ounces (250 g) yogurt, 1  ounces (44 g) natural cheese, or 1 cup (240 mL) fortified soy milk. Fats and oils  Aim for about 5 teaspoons (21 g) per day. Choose monounsaturated fats, such as canola and olive oils, avocados, peanut butter, and most nuts, or polyunsaturated fats, such as sunflower, corn, and  soybean oils, walnuts, pine nuts, sesame seeds, sunflower seeds, and flaxseed. Beverages  Aim for six 8-oz glasses of water per day. Limit coffee to three to five 8-oz cups per day.  Limit caffeinated beverages that have added calories, such as soda and energy drinks.  Limit alcohol intake to no more than 1 drink a day for nonpregnant women and 2 drinks a day for men. One drink equals 12 oz of beer (355 mL), 5 oz of wine (148 mL), or 1 oz of hard liquor (44 mL). Seasoning and other foods  Avoid adding excess amounts of salt to your foods. Try flavoring foods with herbs and spices instead of salt.  Avoid adding sugar to foods.  Try using oil-based dressings, sauces, and spreads instead of solid fats. This information is based on general [place] nutrition guidelines. For more information, visit [employer].es. Exact amounts may vary based on your nutrition needs. Summary  A healthy eating plan may help you to maintain a healthy weight, reduce the risk of chronic diseases, and stay active throughout your life.  Plan your meals. Make sure you eat the right portions of a variety of nutrient-rich foods.  Try baking, boiling, grilling, or broiling instead of frying.  Choose healthy options in all settings, including home, work, school, restaurants, or stores. This information is not intended to replace advice given to you by your health care provider. Make sure you discuss any questions you have with your health care provider. Document Revised: 12/15/2017 Document Reviewed: 12/15/2017 Elsevier Patient Education  2021 [place] stands for fermentable oligosaccharides,  disaccharides, monosaccharides, and polyols. These are sugars that are hard for some people to digest. A low-FODMAP eating plan may help some people who have irritable bowel syndrome (IBS) and certain other bowel (intestinal) diseases to manage their symptoms. This meal plan can be complicated to follow. Work with a diet and nutrition specialist (dietitian) to make a low-FODMAP eating plan that is right for you. A dietitian can help make sure that you get enough nutrition from this diet. What are tips for following this plan? Reading food labels  Check labels for hidden FODMAPs such as: ? High-fructose syrup. ? Honey. ? Agave. ? Natural fruit flavors. ? Onion or garlic powder.  Choose low-FODMAP foods that contain 3-4 grams of fiber per serving.  Check food labels for serving sizes. Eat only one serving at a time to make sure FODMAP levels stay low. Shopping  Shop with a list of foods that are recommended on this diet and make a meal plan. Meal planning  Follow a low-FODMAP eating plan for up to 6 weeks, or as told by your health care provider or dietitian.  To follow the eating plan: 1. Eliminate high-FODMAP foods from your diet completely. Choose only low-FODMAP foods to eat. You will do this for 2-6 weeks. 2. Gradually reintroduce high-FODMAP foods into your diet one at a time. Most people should wait a few days before introducing the next new high-FODMAP food into their meal plan. Your dietitian can recommend how quickly you may reintroduce foods. 3. Keep a daily record of what and how much you eat and drink. Make note of any symptoms that you have after eating. 4. Review your daily record with a dietitian regularly to identify which foods you can eat and which foods you should avoid. General tips  Drink enough fluid each day to keep your urine pale yellow.  Avoid processed foods. These often have added  sugar and may be high in FODMAPs.  Avoid most dairy products, whole grains,  and sweeteners.  Work with a dietitian to make sure you get enough fiber in your diet.  Avoid high FODMAP foods at meals to manage symptoms. Recommended foods Fruits Bananas, oranges, tangerines, lemons, limes, blueberries, raspberries, strawberries, grapes, cantaloupe, honeydew melon, kiwi, papaya, passion fruit, and pineapple. Limited amounts of dried cranberries, banana chips, and shredded coconut. Vegetables Eggplant, zucchini, cucumber, peppers, green beans, bean sprouts, lettuce, arugula, kale, Swiss chard, spinach, collard greens, bok choy, summer squash, potato, and tomato. Limited amounts of corn, carrot, and sweet potato. Green parts of scallions. Grains Gluten-free grains, such as rice, oats, buckwheat, quinoa, corn, polenta, and millet. Gluten-free pasta, bread, or cereal. Rice noodles. Corn tortillas. Meats and other proteins Unseasoned beef, pork, poultry, or fish. Eggs. [place] Salines. Tofu (firm) and tempeh. Limited amounts of nuts and seeds, such as almonds, walnuts, [place] nuts, pecans, peanuts, nut butters, pumpkin seeds, chia seeds, and sunflower seeds. Dairy Lactose-free milk, yogurt, and kefir. Lactose-free cottage cheese and ice cream. Non-dairy milks, such as almond, coconut, hemp, and rice milk. Non-dairy yogurt. Limited amounts of goat cheese, brie, mozzarella, parmesan, swiss, and other hard cheeses. Fats and oils Butter-free spreads. Vegetable oils, such as olive, canola, and sunflower oil. Seasoning and other foods Artificial sweeteners with names that do not end in "ol," such as aspartame, saccharine, and stevia. Maple syrup, white table sugar, raw sugar, brown sugar, and molasses. Mayonnaise, soy sauce, and tamari. Fresh basil, coriander, parsley, rosemary, and thyme. Beverages Water and mineral water. Sugar-sweetened soft drinks. Small amounts of orange juice or cranberry juice. Black and green tea. Most dry wines. Coffee. The items listed above may not be a complete list  of foods and beverages you can eat. Contact a dietitian for more information. Foods to avoid Fruits Fresh, dried, and juiced forms of apple, pear, watermelon, peach, plum, cherries, apricots, blackberries, boysenberries, figs, nectarines, and mango. Avocado. Vegetables Chicory root, artichoke, asparagus, cabbage, snow peas, Brussels sprouts, broccoli, sugar snap peas, mushrooms, celery, and cauliflower. Onions, garlic, leeks, and the white part of scallions. Grains Wheat, including kamut, durum, and semolina. Barley and bulgur. Couscous. Wheat-based cereals. Wheat noodles, bread, crackers, and pastries. Meats and other proteins Fried or fatty meat. Sausage. Cashews and pistachios. Soybeans, baked beans, black beans, chickpeas, kidney beans, fava beans, navy beans, lentils, black-eyed peas, and split peas. Dairy Milk, yogurt, ice cream, and soft cheese. Cream and sour cream. Milk-based sauces. Custard. Buttermilk. Soy milk. Seasoning and other foods Any sugar-free gum or candy. Foods that contain artificial sweeteners such as sorbitol, mannitol, isomalt, or xylitol. Foods that contain honey, high-fructose corn syrup, or agave. Bouillon, vegetable stock, beef stock, and chicken stock. Garlic and onion powder. Condiments made with onion, such as hummus, chutney, pickles, relish, salad dressing, and salsa. Tomato paste. Beverages Chicory-based drinks. Coffee substitutes. Chamomile tea. Fennel tea. Sweet or fortified wines such as port or sherry. Diet soft drinks made with isomalt, mannitol, maltitol, sorbitol, or xylitol. Apple, pear, and mango juice. Juices with high-fructose corn syrup. The items listed above may not be a complete list of foods and beverages you should avoid. Contact a dietitian for more information. Summary  FODMAP stands for fermentable oligosaccharides, disaccharides, monosaccharides, and polyols. These are sugars that are hard for some people to digest.  A low-FODMAP eating  plan is a short-term diet that helps to ease symptoms of certain bowel diseases.  The eating plan usually lasts up to 6  weeks. After that, high-FODMAP foods are reintroduced gradually and one at a time. This can help you find out which foods may be causing symptoms.  A low-FODMAP eating plan can be complicated. It is best to work with a dietitian who has experience with this type of plan. This information is not intended to replace advice given to you by your health care provider. Make sure you discuss any questions you have with your health care provider. Document Revised: 01/20/2020 Document Reviewed: 01/20/2020 Elsevier Patient Education  [place].

## 2021-07-30 NOTE — Progress Notes (Signed)
Established Patient Office Visit  Subjective:  Patient ID: Kenneth Clarke, male    DOB: 2000-10-30  Age: 20 y.o. MRN: [id number]  CC:  Chief Complaint  Patient presents with   Abdominal Pain    Patient states that he has been having some abdominal pain at the top of stomach to the bottom. Patient states when we work out some of the pain goes away.    HPI Kenneth Clarke presents for abdominal pain  Generalized. No triggers that he can identify Only pattern is working out improves pain Aching burning pain No change to bowel movements. Denies nausea and vomiting. No sudden weight changes.   Otherwise no acute concerns Past Medical History:  Diagnosis Date   Asthma     No past surgical history on file.  No family history on file.  Social History   Socioeconomic History   Marital status: Single    Spouse name: Not on file   Number of children: Not on file   Years of education: Not on file   Highest education level: Not on file  Occupational History   Not on file  Tobacco Use   Smoking status: Never   Smokeless tobacco: Never  Substance and Sexual Activity   Alcohol use: Not Currently   Drug use: Not Currently   Sexual activity: Not on file  Other Topics Concern   Not on file  Social History Narrative   Not on file   Social Determinants of Health   Financial Resource Strain: Not on file  Food Insecurity: Not on file  Transportation Needs: Not on file  Physical Activity: Not on file  Stress: Not on file  Social Connections: Not on file  Intimate Partner Violence: Not on file    Outpatient Medications Prior to Visit  Medication Sig Dispense Refill   albuterol (PROVENTIL HFA;VENTOLIN HFA) 108 (90 BASE) MCG/ACT inhaler Inhale 2 puffs into the lungs every 6 (six) hours as needed. For shortness of breath     No facility-administered medications prior to visit.    No Known Allergies  ROS Review of Systems  Constitutional: Negative.   HENT: Negative.     Eyes: Negative.   Respiratory: Negative.    Cardiovascular: Negative.   Gastrointestinal: Negative.   Genitourinary: Negative.   Musculoskeletal: Negative.   Skin: Negative.   Neurological: Negative.   Psychiatric/Behavioral: Negative.    All other systems reviewed and are negative.    Objective:    Physical Exam Constitutional:      General: He is not in acute distress.    Appearance: Normal appearance. He is normal weight. He is not ill-appearing, toxic-appearing or diaphoretic.  Cardiovascular:     Rate and Rhythm: Normal rate and regular rhythm.     Heart sounds: Normal heart sounds. No murmur heard.   No friction rub. No gallop.  Pulmonary:     Effort: Pulmonary effort is normal. No respiratory distress.     Breath sounds: Normal breath sounds. No stridor. No wheezing, rhonchi or rales.  Chest:     Chest wall: No tenderness.  Abdominal:     General: Abdomen is flat. Bowel sounds are normal. There is no distension or abdominal bruit. There are no signs of injury.     Palpations: Abdomen is soft.     Tenderness: There is generalized abdominal tenderness (mild).  Neurological:     General: No focal deficit present.     Mental Status: He is alert and oriented to person,  place, and time. Mental status is at baseline.  Psychiatric:        Mood and Affect: Mood normal.        Behavior: Behavior normal.        Thought Content: Thought content normal.        Judgment: Judgment normal.    BP 119/72   Pulse 69   Temp 97.6 F (36.4 C) (Temporal)   Resp 18   Ht 5' 11"  (1.803 m)   Wt 144 lb 3.2 oz (65.4 kg)   SpO2 98%   BMI 20.11 kg/m  Wt Readings from Last 3 Encounters:  11/15/20 144 lb 3.2 oz (65.4 kg)  10/11/20 134 lb (60.8 kg)  08/22/20 134 lb (60.8 kg)     Health Maintenance Due  Topic Date Due   HPV VACCINES (1 - Male 2-dose series) Never done   INFLUENZA VACCINE  Never done       Topic Date Due   HPV VACCINES (1 - Male 2-dose series) Never done     No results found for: TSH No results found for: WBC, HGB, HCT, MCV, PLT No results found for: NA, K, CHLORIDE, CO2, GLUCOSE, BUN, CREATININE, BILITOT, ALKPHOS, AST, ALT, PROT, ALBUMIN, CALCIUM, ANIONGAP, EGFR, GFR No results found for: CHOL No results found for: HDL No results found for: LDLCALC No results found for: TRIG No results found for: CHOLHDL No results found for: HGBA1C    Assessment & Plan:   Problem List Items Addressed This Visit   None Visit Diagnoses     GAD (generalized anxiety disorder)    -  Primary   Relevant Medications   escitalopram (LEXAPRO) 10 MG tablet   Gastroesophageal reflux disease without esophagitis       Relevant Medications   pantoprazole (PROTONIX) 40 MG tablet       Meds ordered this encounter  Medications   pantoprazole (PROTONIX) 40 MG tablet    Sig: Take 1 tablet (40 mg total) by mouth daily.    Dispense:  30 tablet    Refill:  3    Order Specific Question:   Supervising Provider    Answer:   Carlota Raspberry, JEFFREY R [[id number]]   escitalopram (LEXAPRO) 10 MG tablet    Sig: Take 1/2 tablet (29m total) before bed daily for one week. Then increase to 1 tablet (178mtotal) before bed daily.    Dispense:  90 tablet    Refill:  0    Order Specific Question:   Supervising Provider    Answer:   GRCarlota RaspberryJEFFREY R [[id number]]    Follow-up: No follow-ups on file.   Question of anxiety contributing. Start lexapro 1062mo qhs. Discussed risks, benefits, and AE. Med check in 6-8 weeks Possible GERD. Start pantoprazole 50m25m qd. Monitor effect.  If symptoms worsening or failing to improve consider labs, imaging, GI referral Patient encouraged to call clinic with any questions, comments, or concerns.  RichMaximiano Coss

## 2022-10-29 ENCOUNTER — Other Ambulatory Visit: Payer: Self-pay

## 2022-10-29 ENCOUNTER — Emergency Department (HOSPITAL_BASED_OUTPATIENT_CLINIC_OR_DEPARTMENT_OTHER): Payer: Managed Care, Other (non HMO) | Admitting: Radiology

## 2022-10-29 ENCOUNTER — Emergency Department (HOSPITAL_BASED_OUTPATIENT_CLINIC_OR_DEPARTMENT_OTHER)
Admission: EM | Admit: 2022-10-29 | Discharge: 2022-10-29 | Disposition: A | Discharge status: Home / Self Care | Payer: Managed Care, Other (non HMO) | Attending: Emergency Medicine | Admitting: Emergency Medicine

## 2022-10-29 ENCOUNTER — Encounter (HOSPITAL_BASED_OUTPATIENT_CLINIC_OR_DEPARTMENT_OTHER): Payer: Self-pay

## 2022-10-29 DIAGNOSIS — Z87891 Personal history of nicotine dependence: Secondary | ICD-10-CM | POA: Diagnosis not present

## 2022-10-29 DIAGNOSIS — R0789 Other chest pain: Secondary | ICD-10-CM

## 2022-10-29 DIAGNOSIS — R1013 Epigastric pain: Secondary | ICD-10-CM | POA: Diagnosis not present

## 2022-10-29 DIAGNOSIS — J45909 Unspecified asthma, uncomplicated: Secondary | ICD-10-CM | POA: Insufficient documentation

## 2022-10-29 LAB — CBC WITH DIFFERENTIAL/PLATELET
Abs Immature Granulocytes: 0.01 10*3/uL (ref 0.00–0.07)
Basophils Absolute: 0 10*3/uL (ref 0.0–0.1)
Basophils Relative: 1 %
Eosinophils Absolute: 0.3 10*3/uL (ref 0.0–0.5)
Eosinophils Relative: 6 %
HCT: 42.5 % (ref 39.0–52.0)
Hemoglobin: 14.7 g/dL (ref 13.0–17.0)
Immature Granulocytes: 0 %
Lymphocytes Relative: 34 %
Lymphs Abs: 1.8 10*3/uL (ref 0.7–4.0)
MCH: 31.9 pg (ref 26.0–34.0)
MCHC: 34.6 g/dL (ref 30.0–36.0)
MCV: 92.2 fL (ref 80.0–100.0)
Monocytes Absolute: 0.5 10*3/uL (ref 0.1–1.0)
Monocytes Relative: 9 %
Neutro Abs: 2.8 10*3/uL (ref 1.7–7.7)
Neutrophils Relative %: 50 %
Platelets: 210 10*3/uL (ref 150–400)
RBC: 4.61 MIL/uL (ref 4.22–5.81)
RDW: 12.1 % (ref 11.5–15.5)
WBC: 5.4 10*3/uL (ref 4.0–10.5)
nRBC: 0 % (ref 0.0–0.2)

## 2022-10-29 LAB — COMPREHENSIVE METABOLIC PANEL
ALT: 13 U/L (ref 0–44)
AST: 16 U/L (ref 15–41)
Albumin: 4.8 g/dL (ref 3.5–5.0)
Alkaline Phosphatase: 59 U/L (ref 38–126)
Anion gap: 8 (ref 5–15)
BUN: 14 mg/dL (ref 6–20)
CO2: 31 mmol/L (ref 22–32)
Calcium: 10 mg/dL (ref 8.9–10.3)
Chloride: 103 mmol/L (ref 98–111)
Creatinine, Ser: 0.91 mg/dL (ref 0.61–1.24)
GFR, Estimated: >60 mL/min (ref >60)
Glucose, Bld: 56 mg/dL — ABNORMAL LOW (ref 70–99)
Potassium: 3.9 mmol/L (ref 3.5–5.1)
Sodium: 142 mmol/L (ref 135–145)
Total Bilirubin: 0.5 mg/dL (ref 0.3–1.2)
Total Protein: 7.7 g/dL (ref 6.5–8.1)

## 2022-10-29 LAB — TROPONIN I (HIGH SENSITIVITY): Troponin I (High Sensitivity): <2 ng/L (ref <18)

## 2022-10-29 NOTE — ED Triage Notes (Signed)
Pt presents POV from home w/family, ambulatory    Pt reports intermittent episodes of pain under his Left breast x2 years. Pt also states he can see his "heart beating" when he's lying in bed.  Today he had an episode of a "shocking, exploding " pain radiating from his throat to his epigastric area after drinking a protein shake.  Pt reports he was dx w/GERD 2 years ago but didn't feel the medication helped him. Therefore, he discontinued the Nexium.

## 2022-10-29 NOTE — ED Provider Notes (Signed)
[facility] Provider Note   CSN: [id number] Arrival date & time: 10/29/22  1009     History  Chief Complaint  Patient presents with   Chest Pain    Kenneth Clarke is a 22 y.o. male.   Chest Pain   22 year old male presents emergency department with complaints of chest pain.  Reports similar chest pain occurring over the past 2 to 3 years.  Saw outpatient physician who prescribed Nexium for GERD of which patient states helped minimally.  Reports chest pain occurring after consumption of food/liquids as well as after physical exercise.  Denies fever, cough, congestion, shortness of breath, abdominal pain, nausea, vomiting, urinary symptoms, change in bowel habits.  Denies history of drug use.  Has taken no medication for current episode.  Denies history of DVT/PE, recent surgery/immobilization, known malignancy, coagulopathy.  Past medical history significant for asthma  Home Medications Prior to Admission medications   Medication Sig Start Date End Date Taking? Authorizing Provider  albuterol (PROVENTIL HFA;VENTOLIN HFA) 108 (90 BASE) MCG/ACT inhaler Inhale 2 puffs into the lungs every 6 (six) hours as needed. For shortness of breath    [provider]  escitalopram (LEXAPRO) 10 MG tablet Take 1/2 tablet (18m total) before bed daily for one week. Then increase to 1 tablet (158mtotal) before bed daily. 11/15/20   MoMaximiano CossNP  pantoprazole (PROTONIX) 40 MG tablet Take 1 tablet (40 mg total) by mouth daily. 11/15/20   MoMaximiano CossNP      Allergies    Patient has no known allergies.    Review of Systems   Review of Systems  Cardiovascular:  Positive for chest pain.  All other systems reviewed and are negative.   Physical Exam Updated Vital Signs BP 127/67 (BP Location: Right Arm)   Pulse 62   Temp 98 F (36.7 C) (Oral)   Resp 14   SpO2 100%  Physical Exam Vitals and nursing note reviewed.  Constitutional:       General: He is not in acute distress.    Appearance: He is well-developed.  HENT:     Head: Normocephalic and atraumatic.  Eyes:     Conjunctiva/sclera: Conjunctivae normal.  Cardiovascular:     Rate and Rhythm: Normal rate and regular rhythm.     Heart sounds: Normal heart sounds. No murmur heard. Pulmonary:     Effort: Pulmonary effort is normal. No respiratory distress.     Breath sounds: Normal breath sounds.     Comments: Lower sternal/xiphoid tenderness.  Very mild epigastric tenderness. Chest:     Chest wall: Tenderness present.  Abdominal:     Palpations: Abdomen is soft.     Tenderness: There is no abdominal tenderness. There is no right CVA tenderness or left CVA tenderness.  Musculoskeletal:        General: No swelling.     Cervical back: Neck supple. No rigidity or tenderness.     Right lower leg: No edema.     Left lower leg: No edema.  Skin:    General: Skin is warm and dry.     Capillary Refill: Capillary refill takes less than 2 seconds.  Neurological:     Mental Status: He is alert.  Psychiatric:        Mood and Affect: Mood normal.     ED Results / Procedures / Treatments   Labs (all labs ordered are listed, but only abnormal results are displayed) Labs Reviewed  COMPREHENSIVE  METABOLIC PANEL - Abnormal; Notable for the following components:      Result Value   Glucose, Bld 56 (*)    All other components within normal limits  CBC WITH DIFFERENTIAL/PLATELET  TROPONIN I (HIGH SENSITIVITY)  TROPONIN I (HIGH SENSITIVITY)    EKG EKG Interpretation  Date/Time:  Tuesday October 29 2022 10:19:01 EST Ventricular Rate:  61 PR Interval:  112 QRS Duration: 93 QT Interval:  367 QTC Calculation: 370 R Axis:   78 Text Interpretation: Sinus rhythm Borderline short PR interval Confirmed by Regan Lemming ([id number]) on 10/29/2022 11:11:02 AM  Radiology DG Chest 2 View  Result Date: 10/29/2022 CLINICAL DATA:  cp EXAM: CHEST - 2 VIEW COMPARISON:  02/14/2010  FINDINGS: Cardiac silhouette is unremarkable. No pneumothorax or pleural effusion. The lungs are clear. The visualized skeletal structures are unremarkable. IMPRESSION: No acute cardiopulmonary process. Electronically Signed   By: Sammie Bench M.D.   On: 10/29/2022 10:35    Procedures Procedures    Medications Ordered in ED Medications - No data to display  ED Course/ Medical Decision Making/ A&P                             Medical Decision Making Amount and/or Complexity of Data Reviewed Labs: ordered. Radiology: ordered.   This patient presents to the ED for concern of chest pain, this involves an extensive number of treatment options, and is a complaint that carries with it a high risk of complications and morbidity.  The differential diagnosis includes GERD, ACS, PE, pneumothorax, pneumonia, pancreatitis, aortic dissection, aortic aneurysm, myocarditis/pericarditis/tamponade, CHF   Co morbidities that complicate the patient evaluation  See HPI   Additional history obtained:  Additional history obtained from EMR External records from outside source obtained and reviewed including hospital records   Lab Tests:  I Ordered, and personally interpreted labs.  The pertinent results include: No leukocytosis noted.  No evidence of anemia.  Platelets within range.  No electrolyte abnormalities appreciated no transaminitis.  No renal dysfunction.  Initial troponin of less than 2 with EKG of sinus rhythm with no acute ischemic change; given duration of symptoms, very low suspicion for ACS.   Imaging Studies ordered:  I ordered imaging studies including chest x-ray I independently visualized and interpreted imaging which showed no acute cardiopulmonary abnormalities I agree with the radiologist interpretation   Cardiac Monitoring: / EKG:  The patient was maintained on a cardiac monitor.  I personally viewed and interpreted the cardiac monitored which showed an underlying  rhythm of: Sinus rhythm without   Consultations Obtained:  N/a   Problem List / ED Course / Critical interventions / Medication management  Chest pain Reevaluation of the patient showed that the patient stayed the same I have reviewed the patients home medicines and have made adjustments as needed   Social Determinants of Health:  Former cigarette use.  Denies illicit drug use.   Test / Admission - Considered:  Other chest pain Vitals signs within normal range and stable throughout visit. Laboratory/imaging studies significant for: See above Patient with chest pain most consistently with GERD.  Low suspicion for ACS given duration of symptoms, negative troponin and EKG with no ischemic changes acutely.  Doubt PE given Wells score for PE 0 and PERC negative.  Doubt pneumothorax, pneumonia, CHF.  Discussed with the patient about placing on PPI/H2 blocker but patient declined for preference of dietary modifications.  Dietary recommendations given to patient and  discharge papers.  Prompt follow-up with primary care recommended for reevaluation of symptoms.  Treatment plan discussed with patient and mother and they acknowledge understanding were agreeable to said plan. Worrisome signs and symptoms were discussed with the patient, and the patient acknowledged understanding to return to the ED if noticed. Patient was stable upon discharge.          Final Clinical Impression(s) / ED Diagnoses Final diagnoses:  Other chest pain    Rx / DC Orders ED Discharge Orders     None         Wilnette Kales, [place] 10/29/22 1204    Regan Lemming, MD 10/29/22 1327

## 2022-10-29 NOTE — ED Notes (Signed)
Wrong time entered. Was supposed to be 1100

## 2022-10-29 NOTE — Discharge Instructions (Signed)
Note the workup today was overall reassuring.  As discussed, most likely source of chest discomfort is from gastritis or reflux.  Recommend changing diet as indicated in your information packet attached.  You can trial over-the-counter Pepcid as well to take for reflux.  Recommend follow-up with primary care for reassessment of symptoms.  Please do not hesitate to return to emergency department for worrisome signs and symptoms we discussed become apparent.

## 2022-10-29 NOTE — ED Notes (Signed)
Patient transported to X-ray 

## 2022-12-03 ENCOUNTER — Ambulatory Visit: Payer: Self-pay | Admitting: Family Medicine
